# Patient Record
Sex: Female | Born: 1950 | Race: White | Hispanic: No | Smoking: Former smoker
Health system: Southern US, Community
[De-identification: ages and names within clinical notes are randomized; demographics above are authoritative.]

---

## 2004-09-09 ENCOUNTER — Encounter: Admission: RE | Admit: 2004-09-09 | Discharge: 2004-09-09 | Payer: Self-pay | Admitting: Obstetrics and Gynecology

## 2007-04-23 ENCOUNTER — Other Ambulatory Visit: Admission: RE | Admit: 2007-04-23 | Discharge: 2007-04-23 | Payer: Self-pay | Admitting: Family Medicine

## 2007-04-30 ENCOUNTER — Encounter: Admission: RE | Admit: 2007-04-30 | Discharge: 2007-04-30 | Payer: Self-pay | Admitting: Family Medicine

## 2008-05-16 ENCOUNTER — Encounter: Admission: RE | Admit: 2008-05-16 | Discharge: 2008-05-16 | Payer: Self-pay | Admitting: Family Medicine

## 2008-11-24 ENCOUNTER — Encounter: Admission: RE | Admit: 2008-11-24 | Discharge: 2008-11-24 | Payer: Self-pay | Admitting: Family Medicine

## 2009-05-25 ENCOUNTER — Encounter: Admission: RE | Admit: 2009-05-25 | Discharge: 2009-05-25 | Payer: Self-pay | Admitting: Family Medicine

## 2010-04-04 ENCOUNTER — Encounter: Payer: Self-pay | Admitting: Obstetrics and Gynecology

## 2011-04-14 ENCOUNTER — Other Ambulatory Visit: Payer: Self-pay | Admitting: Family Medicine

## 2011-04-14 DIAGNOSIS — Z1231 Encounter for screening mammogram for malignant neoplasm of breast: Secondary | ICD-10-CM

## 2011-05-05 ENCOUNTER — Ambulatory Visit
Admission: RE | Admit: 2011-05-05 | Discharge: 2011-05-05 | Disposition: A | Discharge status: Home / Self Care | Payer: BC Managed Care – PPO | Source: Ambulatory Visit | Attending: Family Medicine | Admitting: Family Medicine

## 2011-05-05 DIAGNOSIS — Z1231 Encounter for screening mammogram for malignant neoplasm of breast: Secondary | ICD-10-CM

## 2011-07-05 ENCOUNTER — Other Ambulatory Visit: Payer: Self-pay | Admitting: Family Medicine

## 2011-07-05 DIAGNOSIS — Z780 Asymptomatic menopausal state: Secondary | ICD-10-CM

## 2011-08-23 ENCOUNTER — Other Ambulatory Visit: Payer: BC Managed Care – PPO

## 2011-09-29 ENCOUNTER — Other Ambulatory Visit: Payer: BC Managed Care – PPO

## 2012-04-02 ENCOUNTER — Other Ambulatory Visit: Payer: Self-pay | Admitting: Family Medicine

## 2012-04-02 DIAGNOSIS — Z1231 Encounter for screening mammogram for malignant neoplasm of breast: Secondary | ICD-10-CM

## 2012-06-04 ENCOUNTER — Ambulatory Visit
Admission: RE | Admit: 2012-06-04 | Discharge: 2012-06-04 | Disposition: A | Discharge status: Home / Self Care | Payer: BC Managed Care – PPO | Source: Ambulatory Visit | Attending: Family Medicine | Admitting: Family Medicine

## 2012-06-04 DIAGNOSIS — Z1231 Encounter for screening mammogram for malignant neoplasm of breast: Secondary | ICD-10-CM

## 2012-06-04 DIAGNOSIS — Z780 Asymptomatic menopausal state: Secondary | ICD-10-CM

## 2013-05-07 ENCOUNTER — Other Ambulatory Visit: Payer: Self-pay

## 2013-05-07 DIAGNOSIS — Z1231 Encounter for screening mammogram for malignant neoplasm of breast: Secondary | ICD-10-CM

## 2013-07-09 ENCOUNTER — Encounter (INDEPENDENT_AMBULATORY_CARE_PROVIDER_SITE_OTHER): Payer: Self-pay

## 2013-07-09 ENCOUNTER — Ambulatory Visit
Admission: RE | Admit: 2013-07-09 | Discharge: 2013-07-09 | Disposition: A | Discharge status: Home / Self Care | Payer: Self-pay | Source: Ambulatory Visit

## 2013-07-09 DIAGNOSIS — Z1231 Encounter for screening mammogram for malignant neoplasm of breast: Secondary | ICD-10-CM

## 2015-01-27 ENCOUNTER — Other Ambulatory Visit: Payer: Self-pay

## 2015-01-27 DIAGNOSIS — Z1231 Encounter for screening mammogram for malignant neoplasm of breast: Secondary | ICD-10-CM

## 2015-03-20 ENCOUNTER — Ambulatory Visit
Admission: RE | Admit: 2015-03-20 | Discharge: 2015-03-20 | Disposition: A | Discharge status: Home / Self Care | Payer: BLUE CROSS/BLUE SHIELD | Source: Ambulatory Visit

## 2015-03-20 DIAGNOSIS — Z1231 Encounter for screening mammogram for malignant neoplasm of breast: Secondary | ICD-10-CM

## 2015-06-22 ENCOUNTER — Ambulatory Visit (INDEPENDENT_AMBULATORY_CARE_PROVIDER_SITE_OTHER): Payer: BLUE CROSS/BLUE SHIELD

## 2015-06-22 ENCOUNTER — Ambulatory Visit (INDEPENDENT_AMBULATORY_CARE_PROVIDER_SITE_OTHER): Payer: BLUE CROSS/BLUE SHIELD | Admitting: Podiatry

## 2015-06-22 ENCOUNTER — Encounter: Payer: Self-pay | Admitting: Podiatry

## 2015-06-22 VITALS — BP 132/66 | HR 69 | Resp 12

## 2015-06-22 DIAGNOSIS — R52 Pain, unspecified: Secondary | ICD-10-CM

## 2015-06-22 DIAGNOSIS — M79672 Pain in left foot: Secondary | ICD-10-CM

## 2015-06-22 DIAGNOSIS — M779 Enthesopathy, unspecified: Secondary | ICD-10-CM | POA: Diagnosis not present

## 2015-06-22 DIAGNOSIS — M19072 Primary osteoarthritis, left ankle and foot: Secondary | ICD-10-CM | POA: Diagnosis not present

## 2015-06-22 DIAGNOSIS — M722 Plantar fascial fibromatosis: Secondary | ICD-10-CM | POA: Diagnosis not present

## 2015-06-22 MED ORDER — DICLOFENAC SODIUM 75 MG PO TBEC: 75.0000 mg | DELAYED_RELEASE_TABLET | Freq: Two times a day (BID) | ORAL | Status: AC

## 2015-06-22 NOTE — Progress Notes (Signed)
   Subjective:    Patient ID: Madison Fields, female    DOB: [birth year], 65 y.o.   MRN: [id number]  HPI  65 year old female presents to the office today with concerns of pain to the arch and the ball of her left foot. She states the pain is worse with walking on her left foot. This has been ongoing for 2 weeks. She compalins of pain to the base of the 2nd and 3rd toe, mostly when she tries to dorsiflex the toes. She also gets pain to the arch of the left foot.   Also has pain to the right heel. This has been ongoing for at least a month. She states it hurts when she first gets up.   No history of injury or trauma. No tingling or numbness. No swelling or redness. No recent treatment. No other complaints at this time.    Review of Systems  Musculoskeletal: Positive for gait problem.  All other systems reviewed and are negative.      Objective:   Physical Exam General: AAO x3, NAD  Dermatological: Are present overlying the dorsal aspect of the first metatarsal from prior bunion surgery which are well-healed. There is no open lesions or pre-ulcerative lesions identified at this time.  Vascular: Dorsalis Pedis artery and Posterior Tibial artery pedal pulses are 2/4 bilateral with immedate capillary fill time. Pedal hair growth present. No varicosities and no lower extremity edema present bilateral. There is no pain with calf compression, swelling, warmth, erythema.   Neruologic: Grossly intact via light touch bilateral. Vibratory intact via tuning fork bilateral. Protective threshold with Semmes Wienstein monofilament intact to all pedal sites bilateral. Patellar and Achilles deep tendon reflexes 2+ bilateral. No Babinski or clonus noted bilateral.   Musculoskeletal: There is pain of range of motion of the second MTPJ on the left side. There is restriction of range of motion without any crepitation. There is no area pinpoint tenderness there is no pain the vibratory sensation. There is  tenderness palpation along the medial band of plantar fascial in the arch of the foot on the left foot. The plantar fascia appears to be intact. On the right foot there is tenderness on the plantar medial tubercle of the calcaneus at the insertion the plantar fascia. There is no pain along the course of the plantar fascia. There is no pain with medial to lateral compression of the calcaneous bilaterally. There is decreased range of motion bilateral first MTPJ. No other areas of tenderness to bilateral lower extremities.   Gait: Unassisted, Nonantalgic.      Assessment & Plan:  65 year old female right plantar fasciitis; left arch pain/plantar fasciitis, capsulitis second MTPJ -Treatment options discussed including all alternatives, risks, and complications -X-rays were obtained and reviewed with the patient. Arthritic changes present left second MTPJ. Heel spur right side. Hardware intact and prior surgery. No evidence of acute fracture. -Etiology of symptoms were discussed -Steroid injection but she wishes to hold off. -Prescribed Voltaren -Plantar fascial braces. Stretching and icing daily. Discussed supportive shoe gear. Discussed orthotics. -For the left side I also discuss orthotics with a cut out to help take pressure the second MTPJ. Discussed progression of the arthritis. -She was scanned for orthotics today and they were sent to [facility] labs. -Follow-up in 3 weeks or sooner if any problems arise. In the meantime, encouraged to call the office with any questions, concerns, change in symptoms.   Celesta Gentile, dPM

## 2015-06-22 NOTE — Patient Instructions (Signed)

## 2015-06-30 ENCOUNTER — Telehealth: Payer: Self-pay | Admitting: *Deleted

## 2015-06-30 ENCOUNTER — Ambulatory Visit: Payer: BLUE CROSS/BLUE SHIELD | Admitting: Podiatry

## 2015-06-30 MED ORDER — MELOXICAM 15 MG PO TABS: 15.0000 mg | ORAL_TABLET | Freq: Every day | ORAL | Status: AC

## 2015-06-30 NOTE — Telephone Encounter (Addendum)
Pt states the diclofenac is giving her terrible stomach cramps, didn't the 1st day but by Friday the cramping was keeping her awake.  Pt states an orthopedic doctor had given her Meloxicam and she had no ill effects.  Dr. Berton Lan switch to Meloxicam.  Informed pt the rx had been sent to the pharmacy.

## 2015-07-20 ENCOUNTER — Encounter: Payer: Self-pay | Admitting: Podiatry

## 2015-07-20 ENCOUNTER — Ambulatory Visit (INDEPENDENT_AMBULATORY_CARE_PROVIDER_SITE_OTHER): Payer: BLUE CROSS/BLUE SHIELD | Admitting: Podiatry

## 2015-07-20 DIAGNOSIS — M19072 Primary osteoarthritis, left ankle and foot: Secondary | ICD-10-CM | POA: Diagnosis not present

## 2015-07-20 DIAGNOSIS — M79672 Pain in left foot: Secondary | ICD-10-CM | POA: Diagnosis not present

## 2015-07-20 DIAGNOSIS — M722 Plantar fascial fibromatosis: Secondary | ICD-10-CM

## 2015-07-20 DIAGNOSIS — M779 Enthesopathy, unspecified: Secondary | ICD-10-CM | POA: Diagnosis not present

## 2015-07-21 DIAGNOSIS — M722 Plantar fascial fibromatosis: Secondary | ICD-10-CM | POA: Insufficient documentation

## 2015-07-21 DIAGNOSIS — M779 Enthesopathy, unspecified: Secondary | ICD-10-CM | POA: Insufficient documentation

## 2015-07-21 DIAGNOSIS — M79673 Pain in unspecified foot: Secondary | ICD-10-CM | POA: Insufficient documentation

## 2015-07-21 NOTE — Progress Notes (Signed)
Patient ID: Madison Fields, female   DOB: [birth year], 65 y.o.   MRN: [id number]  Subjective: 65 year old female presents the office today for follow-up evaluation of plantar fasciitis, arch pain, capsulitis second MPJ. She states that overall she is doing better. She was taken anti-inflammatories as well as stretching and wearing the plantar fascial braces. She presents a pickup orthotics as well.  Denies any systemic complaints such as fevers, chills, nausea, vomiting. No acute changes since last appointment, and no other complaints at this time.   Objective: AAO x3, NAD DP/PT pulses palpable bilaterally, CRT less than 3 seconds Protective sensation intact with Simms Weinstein monofilament There is mild, but greatly improved, tenderness to palpation along the plantar medial tubercle of the calcaneus at the insertion of plantar fascia on the right and left foot. There is no pain along the course of the plantar fascia within the arch of the foot. Plantar fascia appears to be intact. There is no pain with lateral compression of the calcaneus or pain with vibratory sensation. There is no pain along the course or insertion of the achilles tendon. No other areas of tenderness to bilateral lower extremities. There is no tenderness on second MPJ of the left foot. No areas of pinpoint bony tenderness or pain with vibratory sensation. MMT 5/5, ROM WNL. No edema, erythema, increase in warmth to bilateral lower extremities.  No open lesions or pre-ulcerative lesions.  No pain with calf compression, swelling, warmth, erythema  Assessment: Resolving bilateral foot pain.  Plan: -All treatment options discussed with the patient including all alternatives, risks, complications.  -At this time continue stretching, icing, anti-inflammatories. Continue supportive shoe gear. Orthotics were dispensed today and oral and written break in instructions were discussed with the patient. -Follow-up in 4 weeks if symptoms  continue or sooner if any issues are to arise. -Patient encouraged to call the office with any questions, concerns, change in symptoms.   Celesta Gentile, DPM

## 2015-08-17 ENCOUNTER — Ambulatory Visit: Payer: BLUE CROSS/BLUE SHIELD | Admitting: Podiatry

## 2016-01-29 ENCOUNTER — Telehealth: Payer: Self-pay | Admitting: Gynecologic Oncology

## 2016-01-29 ENCOUNTER — Encounter: Payer: Self-pay | Admitting: Gynecologic Oncology

## 2016-01-29 NOTE — Telephone Encounter (Signed)
Tc call to pt to schedule appt w/Dr. Denman George. Original appt was 12/27, but the pt wanted something sooner. Gave pt appt for  12/13 at 930am. Pt agreed to the appt date and time. Demographics verified. Letter mailed to the pt.

## 2016-02-16 ENCOUNTER — Ambulatory Visit (HOSPITAL_BASED_OUTPATIENT_CLINIC_OR_DEPARTMENT_OTHER)
Admission: RE | Admit: 2016-02-16 | Discharge: 2016-02-16 | Disposition: A | Discharge status: Home / Self Care | Payer: BLUE CROSS/BLUE SHIELD | Source: Ambulatory Visit | Attending: Podiatry | Admitting: Podiatry

## 2016-02-16 ENCOUNTER — Encounter: Payer: Self-pay | Admitting: Podiatry

## 2016-02-16 ENCOUNTER — Ambulatory Visit (INDEPENDENT_AMBULATORY_CARE_PROVIDER_SITE_OTHER): Payer: BLUE CROSS/BLUE SHIELD | Admitting: Podiatry

## 2016-02-16 DIAGNOSIS — M19072 Primary osteoarthritis, left ankle and foot: Secondary | ICD-10-CM | POA: Insufficient documentation

## 2016-02-16 DIAGNOSIS — M79672 Pain in left foot: Secondary | ICD-10-CM | POA: Insufficient documentation

## 2016-02-16 DIAGNOSIS — R52 Pain, unspecified: Secondary | ICD-10-CM | POA: Diagnosis not present

## 2016-02-16 DIAGNOSIS — M722 Plantar fascial fibromatosis: Secondary | ICD-10-CM | POA: Diagnosis not present

## 2016-02-16 MED ORDER — METHYLPREDNISOLONE 4 MG PO TBPK: ORAL_TABLET | ORAL | 0 refills | Status: DC

## 2016-02-16 NOTE — Progress Notes (Signed)
Subjective: 65 year old female presents the office today for recurrence of left heel pain. She states the bottom of the heel is hurting quite a bit. After her last appointment with me she was doing pretty good however that she first gets up of she sits for some time and stands backup she gets quite a bit of pain to her heel. She is also mild tenderness to the right foot although she states that this is manageable. She states that she has pain in the mornings to both of her feet. Denies any systemic complaints such as fevers, chills, nausea, vomiting. No acute changes since last appointment, and no other complaints at this time.   Objective: AAO x3, NAD DP/PT pulses palpable bilaterally, CRT less than 3 seconds Tenderness to palpation along the plantar medial and lateral tubercle of the calcaneus at the insertion of plantar fascia on the left foot. There is no pain along the course of the plantar fascia within the arch of the foot. Plantar fascia appears to be intact. There is no pain with lateral compression of the calcaneus or pain with vibratory sensation. There is no pain along the course or insertion of the achilles tendon. Minimal tenderness to palpation to the right heel. There is no other areas of pinpoint bony tenderness or pain the vibratory sensation bilaterally.No other areas of tenderness to bilateral lower extremities. No open lesions or pre-ulcerative lesions.  No pain with calf compression, swelling, warmth, erythema  Assessment: 65 year old female reoccurrence left heel pain, plantar fasciitis  Plan: -All treatment options discussed with the patient including all alternatives, risks, complications.  -X-rays were obtained and reviewed with the patient. No evidence of acute fracture.  -Patient elects to proceed with steroid injection into the left heel. Under sterile skin preparation, a total of 2.5cc of kenalog 10, 0.5% Marcaine plain, and 2% lidocaine plain were infiltrated into the  symptomatic area without complication. A band-aid was applied. Patient tolerated the injection well without complication. Post-injection care with discussed with the patient. Discussed with the patient to ice the area over the next couple of days to help prevent a steroid flare.  -Night splint dispensed -Discussed possible physical therapy if she continues to have tightness and pain in the mornings or after rest -Prescribed Medrol Dosepak -Ice to the area -Stretching daily -Follow-up in 3 weeks -Patient encouraged to call the office with any questions, concerns, change in symptoms.   Celesta Gentile, DPM

## 2016-02-23 NOTE — Progress Notes (Signed)
Consult Note: Gyn-Onc  Consult was requested by Dr. Nelda Marseille for the evaluation of Madison Fields 65 y.o. female  CC:  Chief Complaint  Patient presents with  . Genital Warts    Assessment/Plan:  Madison Fields  is a 65 y.o.  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  I am recommending combination laser and excisional procedure.  We discussed options including TCA, Aldara (both of which she has tried) versus surgery. She is leaning towards surgery.  I discussed that we would debulk the thicker lesions with a scalpel and may require some suturing followed by laser of the flat areas. I discussed that there is a very high probability of recurrence after treatment and she may require additional therapies.  I discussed postoperative healing and wound care.  I discussed risks including infection, disfigurement, wound separation.   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 She did not tolerate the aldara due to severe perineal irritation.  She is very irritated during toileting and sitting from the lesions and desires treatment for them that palliates symptoms.  Last colonoscopy was normal in 2017.  Current Meds:  Outpatient Encounter Prescriptions as of 02/24/2016  Medication Sig  . atorvastatin (LIPITOR) 10 MG tablet TAKE ONE TABLET BY MOUTH ONCE A DAY FOR 30 DAYS.  . Calcium Carbonate (CALCIUM 600 PO) Take by mouth.  . Cholecalciferol (VITAMIN D PO) Take by mouth.  . meloxicam (MOBIC) 15 MG tablet Take 1 tablet (15 mg total) by mouth daily.  Marland Kitchen PREMARIN vaginal cream USE AS DIRECTED 0.5G TWICE PER WEEK VAGINAL 30 DAYS  . rosuvastatin (CRESTOR) 5 MG tablet Take 5 mg by mouth daily.  . sertraline (ZOLOFT) 25 MG tablet TAKE 1/2 TABLET ONCE A DAY BY MOUTH  .  diclofenac (VOLTAREN) 75 MG EC tablet Take 1 tablet (75 mg total) by mouth 2 (two) times daily. (Patient not taking: Reported on 02/24/2016)  . [DISCONTINUED] methylPREDNISolone (MEDROL DOSEPAK) 4 MG TBPK tablet Take as directed (Patient not taking: Reported on 02/24/2016)   No facility-administered encounter medications on file as of 02/24/2016.     Allergy:  Allergies  Allergen Reactions  . Diclofenac Sodium Other (See Comments)    Abdominal cramping.    Social Hx:   Social History   Social History  . Marital status: Unknown    Spouse name: N/A  . Number of children: N/A  . Years of education: N/A   Occupational History  . Not on file.   Social History Main Topics  . Smoking status: Former Smoker    Packs/day: 0.50    Years: 15.00    Types: Cigarettes    Quit date: 03/13/2006  . Smokeless tobacco: Never Used  . Alcohol use 0.0 oz/week     Comment: wine with dinner occasionally  . Drug use: No  . Sexual activity: Not on file   Other Topics Concern  . Not on file   Social History Narrative  . No narrative on file    Past Surgical Hx: History reviewed. No pertinent surgical history.  Past Medical Hx: History reviewed. No pertinent past medical history.  Past Gynecological History:  Anogenital warts. Remote history of hysterectomy for fibroids. No dysplasia. Last biopsy was in 2013 which showed benign warts. No LMP recorded.  Patient has had a hysterectomy.  Family Hx: History reviewed. No pertinent family history.  Review of Systems:  Constitutional  Feels well,    ENT Normal appearing ears and nares bilaterally Skin/Breast  No rash, sores, jaundice, itching, dryness Cardiovascular  No chest pain, shortness of breath, or edema  Pulmonary  No cough or wheeze.  Gastro Intestinal  No nausea, vomitting, or diarrhoea. No bright red blood per rectum, no abdominal pain, change in bowel movement, or constipation.  Genito Urinary  No frequency, urgency,  dysuria, + anogenital irritation Musculo Skeletal  No myalgia, arthralgia, joint swelling or pain  Neurologic  No weakness, numbness, change in gait,  Psychology  No depression, anxiety, insomnia.   Vitals:  Blood pressure 139/71, pulse 61, temperature 98.1 F (36.7 C), temperature source Oral, resp. rate 18, height 5' 1.75" (1.568 m), weight 156 lb 4.8 oz (70.9 kg), SpO2 100 %.  Physical Exam: WD in NAD Neck  Supple NROM, without any enlargements.  Lymph Node Survey No cervical supraclavicular or inguinal adenopathy Cardiovascular  Pulse normal rate, regularity and rhythm. S1 and S2 normal.  Lungs  Clear to auscultation bilateraly, without wheezes/crackles/rhonchi. Good air movement.  Skin  No rash/lesions/breakdown  Psychiatry  Alert and oriented to person, place, and time  Abdomen  Normoactive bowel sounds, abdomen soft, non-tender and obese without evidence of hernia.  Back No CVA tenderness Genito Urinary  Vulva/vagina: 6cm patch of raised warty tissue on right medial buttock adjacent to anus and posterior perineum. Ring of warty disease posteriorally around introitus Lt>Rt.  Rectal  deferred Extremities  No bilateral cyanosis, clubbing or edema.   Madison Eva, MD  02/24/2016, 10:33 AM

## 2016-02-24 ENCOUNTER — Ambulatory Visit: Payer: BLUE CROSS/BLUE SHIELD | Attending: Gynecologic Oncology | Admitting: Gynecologic Oncology

## 2016-02-24 ENCOUNTER — Encounter: Payer: Self-pay | Admitting: Gynecologic Oncology

## 2016-02-24 VITALS — BP 139/71 | HR 61 | Temp 98.1°F | Resp 18 | Ht 61.75 in | Wt 156.3 lb

## 2016-02-24 DIAGNOSIS — Z888 Allergy status to other drugs, medicaments and biological substances status: Secondary | ICD-10-CM | POA: Diagnosis not present

## 2016-02-24 DIAGNOSIS — Z90710 Acquired absence of both cervix and uterus: Secondary | ICD-10-CM | POA: Diagnosis not present

## 2016-02-24 DIAGNOSIS — Z87891 Personal history of nicotine dependence: Secondary | ICD-10-CM | POA: Diagnosis not present

## 2016-02-24 DIAGNOSIS — A630 Anogenital (venereal) warts: Secondary | ICD-10-CM

## 2016-02-24 NOTE — Patient Instructions (Signed)
Madison Fields will call you with Surgical date and time.               Preparing for your Surgery  Plan for surgery on: To be determined.  Pre-operative Testing -You will receive a phone call from presurgical testing at [facility] to arrange for a pre-operative testing appointment before your surgery.  This appointment normally occurs one to two weeks before your scheduled surgery.   -Bring your insurance card, copy of an advanced directive if applicable, medication list  -At that visit, you will be asked to sign a consent for a possible blood transfusion in case a transfusion becomes necessary during surgery.  The need for a blood transfusion is rare but having consent is a necessary part of your care.     -You should not be taking blood thinners or aspirin at least ten days prior to surgery unless instructed by your surgeon.  Day Before Surgery at [place] will be asked to take in a light diet the day before surgery.  Avoid carbonated beverages.  You will be advised to have nothing to eat or drink after midnight the evening before.     Eat a light diet the day before surgery.  Examples including soups, broths,  toast, yogurt, mashed potatoes.  Things to avoid include carbonated beverages  (fizzy beverages), raw fruits and raw vegetables, or beans.    If your bowels are filled with gas, your surgeon will have difficulty  visualizing your pelvic organs which increases your surgical risks.  Your role in recovery Your role is to become active as soon as directed by your doctor, while still giving yourself time to heal.  Rest when you feel tired. You will be asked to do the following in order to speed your recovery:  - Cough and breathe deeply. This helps toclear and expand your lungs and can prevent pneumonia. You may be given a spirometer to practice deep breathing. A staff member will show you how to use the spirometer. - Do mild physical activity. Walking or moving your legs help your  circulation and body functions return to normal. A staff member will help you when you try to walk and will provide you with simple exercises. Do not try to get up or walk alone the first time. - Actively manage your pain. Managing your pain lets you move in comfort. We will ask you to rate your pain on a scale of zero to 10. It is your responsibility to tell your doctor or nurse where and how much you hurt so your pain can be treated.  Special Considerations -If you are diabetic, you may be placed on insulin after surgery to have closer control over your blood sugars to promote healing and recovery.  This does not mean that you will be discharged on insulin.  If applicable, your oral antidiabetics will be resumed when you are tolerating a solid diet.  -Your final pathology results from surgery should be available by the Friday after surgery and the results will be relayed to you when available.  Eat a light diet the day before surgery.  Examples including soups, broths, toast, yogurt, mashed potatoes.  Things to avoid include carbonated beverages (fizzy beverages), raw fruits and raw vegetables, or beans.   If your bowels are filled with gas, your surgeon will have difficulty visualizing your pelvic organs which increases your surgical risks. Blood Transfusion Information WHAT IS A BLOOD TRANSFUSION? A transfusion is the replacement of blood or  some of its parts. Blood is made up of multiple cells which provide different functions.  Red blood cells carry oxygen and are used for blood loss replacement.  White blood cells fight against infection.  Platelets control bleeding.  Plasma helps clot blood.  Other blood products are available for specialized needs, such as hemophilia or other clotting disorders. BEFORE THE TRANSFUSION  Who gives blood for transfusions?   You may be able to donate blood to be used at a later date on yourself (autologous donation).  Relatives can be asked to  donate blood. This is generally not any safer than if you have received blood from a stranger. The same precautions are taken to ensure safety when a relative's blood is donated.  Healthy volunteers who are fully evaluated to make sure their blood is safe. This is blood bank blood. Transfusion therapy is the safest it has ever been in the practice of medicine. Before blood is taken from a donor, a complete history is taken to make sure that person has no history of diseases nor engages in risky social behavior (examples are intravenous drug use or sexual activity with multiple partners). The donor's travel history is screened to minimize risk of transmitting infections, such as malaria. The donated blood is tested for signs of infectious diseases, such as HIV and hepatitis. The blood is then tested to be sure it is compatible with you in order to minimize the chance of a transfusion reaction. If you or a relative donates blood, this is often done in anticipation of surgery and is not appropriate for emergency situations. It takes many days to process the donated blood. RISKS AND COMPLICATIONS Although transfusion therapy is very safe and saves many lives, the main dangers of transfusion include:   Getting an infectious disease.  Developing a transfusion reaction. This is an allergic reaction to something in the blood you were given. Every precaution is taken to prevent this. The decision to have a blood transfusion has been considered carefully by your caregiver before blood is given. Blood is not given unless the benefits outweigh the risks.

## 2016-02-29 ENCOUNTER — Telehealth: Payer: Self-pay | Admitting: *Deleted

## 2016-02-29 NOTE — Telephone Encounter (Signed)
Pt states saw Dr. Jacqualyn Posey over a 1 week ago, had shots and steroid, felt better a couple of days, and even wore her husband's boot which helped but is too big. I told pt I felt she would benefit from an appt and possible injections again and possibly oral antiinflammatory. Pt agreed and I transferred to schedulers.

## 2016-03-01 ENCOUNTER — Encounter: Payer: Self-pay | Admitting: Podiatry

## 2016-03-01 ENCOUNTER — Ambulatory Visit (INDEPENDENT_AMBULATORY_CARE_PROVIDER_SITE_OTHER): Payer: BLUE CROSS/BLUE SHIELD | Admitting: Podiatry

## 2016-03-01 DIAGNOSIS — M722 Plantar fascial fibromatosis: Secondary | ICD-10-CM | POA: Diagnosis not present

## 2016-03-01 MED ORDER — MELOXICAM 15 MG PO TABS: 15.0000 mg | ORAL_TABLET | Freq: Every day | ORAL | 1 refills | Status: DC

## 2016-03-01 NOTE — Patient Instructions (Signed)

## 2016-03-08 NOTE — Progress Notes (Signed)
Subjective: 65 year old female presents the office today for concerns of reoccurring pain to her left heel. She states that she did get some relief after the injection she took the Medrol Dosepak. She was starting to feel better for the pain is starting to come back. She denies any numbness or tingling. Denies any swelling or redness. She's been stretching, icing as well. She is no longer taking the anti-inflammatory medicine. His been using over-the-counter inserts. Denies any systemic complaints such as fevers, chills, nausea, vomiting. No acute changes since last appointment, and no other complaints at this time.   Objective: AAO x3, NAD DP/PT pulses palpable bilaterally, CRT less than 3 seconds Tenderness continues to palpation along the plantar medial tubercle of the calcaneus at the insertion of plantar fascia on the left foot. There is no pain along the course of the plantar fascia within the arch of the foot. Plantar fascia appears to be intact. There is no pain with lateral compression of the calcaneus or pain with vibratory sensation. There is no pain along the course or insertion of the achilles tendon. No other areas of tenderness to bilateral lower extremities. No open lesions or pre-ulcerative lesions.  No pain with calf compression, swelling, warmth, erythema  Assessment: Left heel pain, plantar fasciitis  Plan: -All treatment options discussed with the patient including all alternatives, risks, complications.  Patient elects to proceed with steroid injection into the left heel. Under sterile skin preparation, a total of 2.5cc of kenalog 10, 0.5% Marcaine plain, and 2% lidocaine plain were infiltrated into the symptomatic area without complication. A band-aid was applied. Patient tolerated the injection well without complication. Post-injection care with discussed with the patient. Discussed with the patient to ice the area over the next couple of days to help prevent a steroid flare.   -Plantar fascial taping applied -Prescribed mobic. Discussed side effects of the medication and directed to stop if any are to occur and call the office.  -Discussed PT -Continue icing/stretching daily. Also discussed CMO -Patient encouraged to call the office with any questions, concerns, change in symptoms.   Celesta Gentile, DPM

## 2016-03-22 ENCOUNTER — Encounter: Payer: Self-pay | Admitting: Podiatry

## 2016-03-22 ENCOUNTER — Ambulatory Visit (INDEPENDENT_AMBULATORY_CARE_PROVIDER_SITE_OTHER): Payer: BLUE CROSS/BLUE SHIELD | Admitting: Podiatry

## 2016-03-22 DIAGNOSIS — M722 Plantar fascial fibromatosis: Secondary | ICD-10-CM | POA: Diagnosis not present

## 2016-03-22 NOTE — Progress Notes (Signed)
Subjective: 66 year old female presents the office today for concerns of reoccurring pain to her left heel. She's tried changing her shoes and she is also order some new dress inserts. She states that she is getting somewhat better although slowly. She has been stretching, icing daily. The injections to help. She has been wearing a tennis shoe which is also been helping. She denies any numbness or tingling. No recent injury. Denies any systemic complaints such as fevers, chills, nausea, vomiting. No acute changes since last appointment, and no other complaints at this time.   Objective: AAO x3, NAD DP/PT pulses palpable bilaterally, CRT less than 3 seconds Tenderness continues to palpation along the plantar medial tubercle of the calcaneus at the insertion of plantar fascia on the left foot. There is also tenderness of plantar central aspect of the heel. There is no pain along the course of the plantar fascia within the arch of the foot. Plantar fascia appears to be intact. There is no pain with lateral compression of the calcaneus or pain with vibratory sensation. There is no pain along the course or insertion of the achilles tendon. No other areas of tenderness to bilateral lower extremities. No open lesions or pre-ulcerative lesions.  No pain with calf compression, swelling, warmth, erythema  Assessment: Left heel pain, plantar fasciitis  Plan: -All treatment options discussed with the patient including all alternatives, risks, complications.  Patient elects to proceed with steroid injection into the left heel. The injection today was done more plan central within the heel.Under sterile skin preparation, a total of 2.5cc of kenalog 10, 0.5% Marcaine plain, and 2% lidocaine plain were infiltrated into the symptomatic area without complication. A band-aid was applied. Patient tolerated the injection well without complication. Post-injection care with discussed with the patient. Discussed with the  patient to ice the area over the next couple of days to help prevent a steroid flare.  -at this time a discussed with her EPAT and/or PT. She will likely proceed with EPAT. A brochure was given today.  -Continue mobic prn -Continue ice/stretching/supportive shoes/orthotics.  -Follow-up in 3 weeks or sooner if needed.  -Patient encouraged to call the office with any questions, concerns, change in symptoms.   Celesta Gentile, DPM

## 2016-04-04 ENCOUNTER — Telehealth: Payer: Self-pay | Admitting: *Deleted

## 2016-04-04 NOTE — Telephone Encounter (Addendum)
Pt states she has questions about custom made orthotics. Pt states she is having a heck of a time with plantar fasciitis, and the orthotics don't fit in all of her shoes, she would like a dress pair and would like to know the cost as well. 05/13/2016-Pt states going on 4 months now with problem Plantar Fasciitis, worsening with the EPAT, and would like to discuss other option, is really limiting her life style. I spoke with pt, she states she feels like there is something else going on with her foot, not just plantar fasciitis. I told pt she would benefit from seeing Dr. Jacqualyn Posey and getting a good explanation of other treatments. Pt agreed and I transferred to schedulers.

## 2016-04-05 ENCOUNTER — Telehealth: Payer: Self-pay | Admitting: *Deleted

## 2016-04-05 NOTE — Telephone Encounter (Signed)
Called patient on her mobile and left a message about the inserts and to call me back in the [place] office and I would be glad to answer any questions that she may have.Madison Fields

## 2016-04-06 ENCOUNTER — Telehealth: Payer: Self-pay | Admitting: Podiatry

## 2016-04-08 ENCOUNTER — Telehealth: Payer: Self-pay | Admitting: *Deleted

## 2016-04-08 NOTE — Telephone Encounter (Signed)
Patient called back and I stated that the dress pair of inserts would be $398 and we would file with the insurance and if the insurance had a deductible would have to meet that and then pay what the insurance would leave after we filed the insurance. Lattie Haw

## 2016-04-12 ENCOUNTER — Ambulatory Visit (INDEPENDENT_AMBULATORY_CARE_PROVIDER_SITE_OTHER): Payer: BLUE CROSS/BLUE SHIELD | Admitting: Podiatry

## 2016-04-12 ENCOUNTER — Encounter: Payer: Self-pay | Admitting: Podiatry

## 2016-04-12 DIAGNOSIS — M722 Plantar fascial fibromatosis: Secondary | ICD-10-CM

## 2016-04-12 NOTE — Progress Notes (Signed)
Subjective: 66 year old female presents the office today for follow-up evaluation of left heel pain, plantar fasciitis. She states that she is doing substantially better compared to last appointment. She has been continuing with supportive shoes, orthotics, stretching, night splint. She has minimal discomfort to her heel and overall is much improved. Denies any systemic complaints such as fevers, chills, nausea, vomiting. No acute changes since last appointment, and no other complaints at this time.   Objective: AAO x3, NAD DP/PT pulses palpable bilaterally, CRT less than 3 seconds There is very minimal tenderness along the plantar medial tubercle of the calcaneus at the insertion of plantar fascia on the left foot more along the lateral heel. There is no pain along the course of the plantar fascia within the arch of the foot. Plantar fascia appears to be intact. There is no pain with lateral compression of the calcaneus or pain with vibratory sensation. There is no pain along the course or insertion of the achilles tendon. No other areas of tenderness to bilateral lower extremities. No open lesions or pre-ulcerative lesions.  No pain with calf compression, swelling, warmth, erythema  Assessment: Left heel pain, plantar fasciitis with much improvement.   Plan: -All treatment options discussed with the patient including all alternatives, risks, complications.  -At this time she is doing much better. We will hold off on any further steroid injections. Discussed that continue stretching, icing, night splint, orthotics, supportive shoes at all 8 and while at home. If symptoms recur discussed whether she'll likely do EPAT.  -Follow-up as needed.   Celesta Gentile, DPM

## 2016-04-19 ENCOUNTER — Ambulatory Visit (INDEPENDENT_AMBULATORY_CARE_PROVIDER_SITE_OTHER): Payer: BLUE CROSS/BLUE SHIELD | Admitting: Podiatry

## 2016-04-19 ENCOUNTER — Encounter: Payer: Self-pay | Admitting: Podiatry

## 2016-04-19 DIAGNOSIS — M779 Enthesopathy, unspecified: Secondary | ICD-10-CM | POA: Diagnosis not present

## 2016-04-19 DIAGNOSIS — M722 Plantar fascial fibromatosis: Secondary | ICD-10-CM

## 2016-04-19 MED ORDER — METHYLPREDNISOLONE 4 MG PO TBPK: ORAL_TABLET | ORAL | 0 refills | Status: AC

## 2016-04-19 NOTE — Progress Notes (Signed)
Subjective: 66 year old female presents the office today for concerns of her left ankle becoming swollen. She states she thinks it is from the way she is walking as her heel has been hurting. Due to the heel pain she has scheduled EPAT treatments. She denies any recent injury or trauma. Denies any systemic complaints such as fevers, chills, nausea, vomiting. No acute changes since last appointment, and no other complaints at this time.   Objective: AAO x3, NAD DP/PT pulses palpable bilaterally, CRT less than 3 seconds Continuation of pain to palpation along the plantar medial tubercle of the calcaneus at the insertion of plantar fascia on the leftfoot. There is no pain along the course of the plantar fascia within the arch of the foot. Plantar fascia appears to be intact. There is no pain with lateral compression of the calcaneus or pain with vibratory sensation. There is no pain along the course or insertion of the achilles tendon. There is mild swelling on both the medial and lateral aspects of the left ankle but there is currently no tenderness along the flexor/PT/Peroneal tendons. No other areas of tenderness to bilateral lower extremities No open lesions or pre-ulcerative lesions.  No pain with calf compression, swelling, warmth, erythema  Assessment: Chronic left heel pian, plantar fasciitis; tendonitis as a result of compensation.   Plan: -All treatment options discussed with the patient including all alternatives, risks, complications.  -Etiology of symptoms were discussed -Medrol dose pack -Trilock ankle brace -Will start EPAT next week. Please dispense a CAM boot when she comes in for th EPAT.  -Patient encouraged to call the office with any questions, concerns, change in symptoms.   Celesta Gentile, DPM

## 2016-04-26 ENCOUNTER — Ambulatory Visit (INDEPENDENT_AMBULATORY_CARE_PROVIDER_SITE_OTHER): Payer: BLUE CROSS/BLUE SHIELD | Admitting: Podiatry

## 2016-04-26 ENCOUNTER — Ambulatory Visit: Payer: BLUE CROSS/BLUE SHIELD

## 2016-04-26 DIAGNOSIS — M779 Enthesopathy, unspecified: Secondary | ICD-10-CM

## 2016-04-26 DIAGNOSIS — M722 Plantar fascial fibromatosis: Secondary | ICD-10-CM

## 2016-04-26 NOTE — Progress Notes (Signed)
   Subjective:    Patient ID: Madison Fields, female    DOB: February 02, 1951, 66 y.o.   MRN: [id number]  HPI  Pt presents with chronic pain in her left heel she has tried various therapies with no success and is here today for ESWT treatment  Review of Systems   All other systems are negative Objective:   Physical Exam Pain on palpation left heel mid to medial heel band 7 of 10 when pressed       Assessment & Plan:  ESWT therapy administered to Lt heel band for 5 joules at 3000 pulses. EPAt delivered to entire fascial band for 3000 pulses. Advised against use of NSAIDs and ice. Advised on boot and night splint usage. Short CAM walker boot was dispensed today. She is to follow up next week for 2nd treatment

## 2016-04-28 ENCOUNTER — Other Ambulatory Visit: Payer: Self-pay | Admitting: Podiatry

## 2016-04-28 NOTE — Telephone Encounter (Signed)
Pt needs an appt prior to future refills. 

## 2016-05-03 ENCOUNTER — Ambulatory Visit: Payer: BLUE CROSS/BLUE SHIELD

## 2016-05-03 DIAGNOSIS — M722 Plantar fascial fibromatosis: Secondary | ICD-10-CM

## 2016-05-03 DIAGNOSIS — M779 Enthesopathy, unspecified: Secondary | ICD-10-CM

## 2016-05-05 NOTE — Progress Notes (Signed)
   Subjective:    Patient ID: Madison Fields, female    DOB: 06-Oct-1950, 67 y.o.   MRN: [id number]  HPI  Pt presents stating that she has noticed a big improvement in the pain in her Lt heel  Review of Systems   All other systems are negative Objective:   Physical Exam Pain on palpation left heel mid to medial heel band 5 of 10 when pressed       Assessment & Plan:  ESWT therapy administered to Lt heel band for 5 joules at 3000 pulses. EPAt delivered to entire fascial band for 3000 pulses. Advised against use of NSAIDs and ice. Advised on boot and night splint usage.  She is to follow up next week for 3rd  treatment

## 2016-05-10 ENCOUNTER — Ambulatory Visit (INDEPENDENT_AMBULATORY_CARE_PROVIDER_SITE_OTHER): Payer: Self-pay | Admitting: Podiatry

## 2016-05-10 DIAGNOSIS — M722 Plantar fascial fibromatosis: Secondary | ICD-10-CM

## 2016-05-10 DIAGNOSIS — M779 Enthesopathy, unspecified: Secondary | ICD-10-CM

## 2016-05-13 NOTE — Progress Notes (Signed)
   Subjective:    Patient ID: Madison Fields, female    DOB: [birth year], 66 y.o.   MRN: [id number]  HPI Pt presents stating that the pain in her Lt heel has returned with same severity as before starting the treatments.  dReview of Systems   All other systems are negative Objective:   Physical Exam Pain on palpation left heel mid to medial heel band 5 of 10 when pressed       Assessment & Plan:  ESWT therapy administered to Lt heel band for 12 joules at 3000 pulses. EPAt delivered to entire fascial band for 3000 pulses. Advised against use of NSAIDs and ice. Advised on boot and night splint usage.  She is to follow up in 4 weeks for 4th  treatment

## 2016-05-16 ENCOUNTER — Encounter: Payer: Self-pay | Admitting: Podiatry

## 2016-05-16 ENCOUNTER — Ambulatory Visit (INDEPENDENT_AMBULATORY_CARE_PROVIDER_SITE_OTHER): Payer: BLUE CROSS/BLUE SHIELD | Admitting: Podiatry

## 2016-05-16 DIAGNOSIS — M722 Plantar fascial fibromatosis: Secondary | ICD-10-CM

## 2016-05-17 NOTE — Progress Notes (Signed)
Subjective: Ms. Lagorio presents to the office today for follow-up and continued pain of left plantar fasciitis/heel pain. She has attempted multiple treatments including injections, medications, bracing, stretching, immobilization, EPAT without any relief. She states that the EPAT made it more sensitive. She presents today to discuss further treatment. She denies any numbness or tingling and denies any recent injury or trauma. No swelling. Denies any systemic complaints such as fevers, chills, nausea, vomiting. No acute changes since last appointment, and no other complaints at this time.   Objective: AAO x3, NAD DP/PT pulses palpable bilaterally, CRT less than 3 seconds There is continued tenderness to palpation along the plantar medial tubercle of the calcaneus at the insertion of plantar fascia on the left foot and this is unchanged. There is no pain along the course of the plantar fascia within the arch of the foot. Plantar fascia appears to be intact. There is no pain with lateral compression of the calcaneus or pain with vibratory sensation. There is no pain along the course or insertion of the achilles tendon. No other areas of tenderness to bilateral lower extremities. Negative tinel sign.  No open lesions or pre-ulcerative lesions.  No pain with calf compression, swelling, warmth, erythema  Assessment: Chronic left heel pain, heel spur/plantar fasciitis  Plan: -All treatment options discussed with the patient including all alternatives, risks, complications.  -At this time given her ongoing symptoms recommended MRI which is ordered today. This is for surgical planning. -Discussed her pending MRI surgical intervention and she agrees to this but will further discuss after the MRI. Discussed EPF and possible PRP -Patient encouraged to call the office with any questions, concerns, change in symptoms.   Celesta Gentile, DPM

## 2016-05-19 ENCOUNTER — Telehealth: Payer: Self-pay | Admitting: *Deleted

## 2016-05-19 NOTE — Telephone Encounter (Signed)
"  Madison Fields is scheduled for a MRI of left ankle w/out contrast.  She has [employer] and it needs authorization."  I will take care of it.

## 2016-05-23 NOTE — Telephone Encounter (Signed)
I left Madison Fields a message that MRI was authorized.  The authorization is valid from 05/23/2016 to 06/21/2016.  The order ID# is [id number].

## 2016-05-25 ENCOUNTER — Ambulatory Visit
Admission: RE | Admit: 2016-05-25 | Discharge: 2016-05-25 | Disposition: A | Discharge status: Home / Self Care | Payer: BLUE CROSS/BLUE SHIELD | Source: Ambulatory Visit | Attending: Podiatry | Admitting: Podiatry

## 2016-05-26 ENCOUNTER — Encounter: Payer: Self-pay | Admitting: Podiatry

## 2016-05-26 ENCOUNTER — Ambulatory Visit (INDEPENDENT_AMBULATORY_CARE_PROVIDER_SITE_OTHER): Payer: BLUE CROSS/BLUE SHIELD | Admitting: Podiatry

## 2016-05-26 DIAGNOSIS — M722 Plantar fascial fibromatosis: Secondary | ICD-10-CM | POA: Diagnosis not present

## 2016-05-26 NOTE — Patient Instructions (Addendum)
Pre-Operative Instructions  Congratulations, you have decided to take an important step to improving your quality of life.  You can be assured that the doctors of [facility] will be with you every step of the way.  1. Plan to be at the surgery center/hospital at least 1 (one) hour prior to your scheduled time unless otherwise directed by the surgical center/hospital staff.  You must have a responsible adult accompany you, remain during the surgery and drive you home.  Make sure you have directions to the surgical center/hospital and know how to get there on time. 2. For hospital based surgery you will need to obtain a history and physical form from your family physician within 1 month prior to the date of surgery- we will give you a form for you primary physician.  3. We make every effort to accommodate the date you request for surgery.  There are however, times where surgery dates or times have to be moved.  We will contact you as soon as possible if a change in schedule is required.   4. No Aspirin/Ibuprofen for one week before surgery.  If you are on aspirin, any non-steroidal anti-inflammatory medications (Mobic, Aleve, Ibuprofen) you should stop taking it 7 days prior to your surgery.  You make take Tylenol  For pain prior to surgery.  5. Medications- If you are taking daily heart and blood pressure medications, seizure, reflux, allergy, asthma, anxiety, pain or diabetes medications, make sure the surgery center/hospital is aware before the day of surgery so they may notify you which medications to take or avoid the day of surgery. 6. No food or drink after midnight the night before surgery unless directed otherwise by surgical center/hospital staff. 7. No alcoholic beverages 24 hours prior to surgery.  No smoking 24 hours prior to or 24 hours after surgery. 8. Wear loose pants or shorts- loose enough to fit over bandages, boots, and casts. 9. No slip on shoes, sneakers are best. 10. Bring  your boot with you to the surgery center/hospital.  Also bring crutches or a walker if your physician has prescribed it for you.  If you do not have this equipment, it will be provided for you after surgery. 11. If you have not been contracted by the surgery center/hospital by the day before your surgery, call to confirm the date and time of your surgery. 12. Leave-time from work may vary depending on the type of surgery you have.  Appropriate arrangements should be made prior to surgery with your employer. 13. Prescriptions will be provided immediately following surgery by your doctor.  Have these filled as soon as possible after surgery and take the medication as directed. 14. Remove nail polish on the operative foot. 15. Wash the night before surgery.  The night before surgery wash the foot and leg well with the antibacterial soap provided and water paying special attention to beneath the toenails and in between the toes.  Rinse thoroughly with water and dry well with a towel.  Perform this wash unless told not to do so by your physician.  Enclosed: 1 Ice pack (please put in freezer the night before surgery)   1 Hibiclens skin cleaner   Pre-op Instructions  If you have any questions regarding the instructions, do not hesitate to call our office at any point during this process.   [place]: [place], [place] [address] [place]: [address] [place], [place] [address] [phone]  Dr. Celesta Gentile, DPM  Endoscopic Plantar Fasciotomy, Care After Refer to this sheet in the next few weeks. These instructions provide you with information about caring for yourself after your procedure. Your health care provider may also give you more specific instructions. Your treatment has been planned according to current medical practices, but problems sometimes occur. Call your health care provider if you have any problems or questions after your procedure. What can I expect  after the procedure? After the procedure, it is common to have:  Foot pain and stiffness.  Swelling in the incision area. Follow these instructions at home: If you have a boot or protective shoe:   Wear it as told by your health care provider. Remove it only as told by your health care provider. Incision care    Follow instructions from your health care provider about how to take care of your incision. Make sure you:  Wash your hands with soap and water before you change your bandage (dressing). If soap and water are not available, use hand sanitizer.  Change your dressing as told by your health care provider.  Leave stitches (sutures), skin glue, or adhesive strips in place. These skin closures may need to stay in place for 2 weeks or longer. If adhesive strip edges start to loosen and curl up, you may trim the loose edges. Do not remove adhesive strips completely unless your health care provider tells you to do that.  Check your incision area every day for signs of infection. Check for:  More redness, swelling, or pain.  More fluid or blood.  Warmth.  Pus or a bad smell. Bathing   Do not take baths, swim, or use a hot tub until your health care provider approves. Ask your health care provider if you can take showers. You may only be allowed to take sponge baths for bathing.  If your health care provider approves bathing and showering, cover the dressing with a watertight plastic bag to protect it from water. Do not let the dressing get wet.  Keep the dressing dry until your health care provider says it can be removed. Managing pain, stiffness, and swelling   If directed, apply ice to the affected area.  Put ice in a plastic bag.  Place a towel between your skin and the bag.  Leave the ice on for 20 minutes, 2-3 times a day.  Move your toes often to avoid stiffness and to lessen swelling.  Raise (elevate) the affected area above the level of your heart while you are  sitting or lying down. Driving   Do not drive for 24 hours if you received a sedative.  Do not drive or operate heavy machinery while taking prescription pain medicine.  Ask your health care provider when it is safe to drive if you have been given a boot or shoe to wear on a foot that you use for driving. Activity   If you have crutches, use them to keep weight off your foot as told by your health care provider.  Return to your normal activities as told by your health care provider. Ask your health care provider what activities are safe for you. General instructions   Take over-the-counter and prescription medicines only as told by your health care provider.  Keep all follow-up visits as told by your health care provider. This is important. Contact a health care provider if:  You have more redness, swelling, or pain at the site of your incision.  You have more fluid or blood coming  from your incision.  Your incision feels warm to the touch.  You have pus or a bad smell coming from your incision.  You have a loss of feeling (numbness) in your foot.  You have difficulty moving your foot.  You have a fever. Get help right away if:  You have swelling in your leg or calf. This information is not intended to replace advice given to you by your health care provider. Make sure you discuss any questions you have with your health care provider. Document Released: 02/09/2015 Document Revised: 08/06/2015 Document Reviewed: 11/11/2014 Elsevier Interactive Patient Education  2017 [employer].

## 2016-05-30 NOTE — Progress Notes (Signed)
Subjective: 66 year old female presents the office they for follow-up evaluation of continued chronic left heel pain. She also presents a discussed MRI and as well as for surgical consultation. She's had ongoing chronic heel pain now for 6 months she's attempted numerous conservative treatments including shoe gear changes, orthotics, immobilization, injections, anti-inflammatory, stretching, icing, EPAT without any significant improvement. At this point she wishes to pursue a surgical intervention to help with her pain. Denies any systemic complaints such as fevers, chills, nausea, vomiting. No acute changes since last appointment, and no other complaints at this time.   Objective: AAO x3, NAD DP/PT pulses palpable bilaterally, CRT less than 3 seconds There is continued tenderness to palpation along the plantar medial tubercle of the calcaneus at the insertion of plantar fascia on the left foot. There is no pain along the course of the plantar fascia within the arch of the foot. Plantar fascia appears to be intact. There is no pain with lateral compression of the calcaneus or pain with vibratory sensation. There is no pain along the course or insertion of the achilles tendon. No other areas of tenderness to bilateral lower extremities.  No open lesions or pre-ulcerative lesions.  No pain with calf compression, swelling, warmth, erythema  Assessment: Chronic left heel pain, plantar fasciitis/tear.  Plan: -All treatment options discussed with the patient including all alternatives, risks, complications.  -I reviewed the MRI with the patient. See below. -I discussed both conservative and surgical treatment options with the patient. At this point she is requesting surgical intervention. -I discussed with her endoscopic plantar fascial release, PRP and she wishes to proceed. -The incision placement as well as the postoperative course was discussed with the patient. I discussed risks of the surgery which  include, but not limited to, infection, bleeding, pain, swelling, need for further surgery, delayed or nonhealing, painful or ugly scar, numbness or sensation changes, over/under correction, recurrence, transfer lesions, further deformity, hardware failure, DVT/PE, loss of toe/foot. Patient understands these risks and wishes to proceed with surgery. The surgical consent was reviewed with the patient all 3 pages were signed. No promises or guarantees were given to the outcome of the procedure. All questions were answered to the best of my ability. Before the surgery the patient was encouraged to call the office if there is any further questions. The surgery will be performed at the [facility] on an outpatient basis. -Patient encouraged to call the office with any questions, concerns, change in symptoms.   IMPRESSION: 1. Moderate plantar fasciitis involving the medial band of the plantar fascia adjacent to the calcaneal insertion with a small partial-thickness tear at the calcaneal insertion.  Celesta Gentile, DPM

## 2016-06-08 ENCOUNTER — Other Ambulatory Visit: Payer: BLUE CROSS/BLUE SHIELD

## 2016-06-14 ENCOUNTER — Telehealth: Payer: Self-pay | Admitting: *Deleted

## 2016-06-14 NOTE — Telephone Encounter (Addendum)
Per Madison Fields, patient called and stated she is scheduled for surgery tomorrow.  She said her foot is feeling better and she is not sure she wants surgery.  Please call patient.  "I have surgery scheduled tomorrow with Dr. Jacqualyn Posey.  Actually my foot is getting a lot better.  I want to postpone that if I can.  It's imperative that I speak with you as soon as possible."    I'm returning your call.  You want to cancel surgery?  "Yes, my foot is feeling so much better.  I don't want to have surgery if I can avoid it.    I called and canceled surgery at [facility].

## 2016-06-20 ENCOUNTER — Other Ambulatory Visit: Payer: BLUE CROSS/BLUE SHIELD

## 2016-06-29 ENCOUNTER — Telehealth: Payer: Self-pay | Admitting: *Deleted

## 2016-06-29 NOTE — Telephone Encounter (Signed)
Called and left a message for the patient to see how she was doing after surgery and stated to call the [place] office at [phone] if she needed to speak with me. Lattie Haw

## 2016-12-08 DIAGNOSIS — M7541 Impingement syndrome of right shoulder: Secondary | ICD-10-CM | POA: Diagnosis not present

## 2016-12-08 DIAGNOSIS — M25511 Pain in right shoulder: Secondary | ICD-10-CM | POA: Diagnosis not present

## 2017-01-10 DIAGNOSIS — R69 Illness, unspecified: Secondary | ICD-10-CM | POA: Diagnosis not present

## 2017-01-17 DIAGNOSIS — Z85828 Personal history of other malignant neoplasm of skin: Secondary | ICD-10-CM | POA: Diagnosis not present

## 2017-01-17 DIAGNOSIS — D2271 Melanocytic nevi of right lower limb, including hip: Secondary | ICD-10-CM | POA: Diagnosis not present

## 2017-01-17 DIAGNOSIS — D2261 Melanocytic nevi of right upper limb, including shoulder: Secondary | ICD-10-CM | POA: Diagnosis not present

## 2017-01-17 DIAGNOSIS — D2262 Melanocytic nevi of left upper limb, including shoulder: Secondary | ICD-10-CM | POA: Diagnosis not present

## 2017-01-17 DIAGNOSIS — D2272 Melanocytic nevi of left lower limb, including hip: Secondary | ICD-10-CM | POA: Diagnosis not present

## 2017-01-17 DIAGNOSIS — D225 Melanocytic nevi of trunk: Secondary | ICD-10-CM | POA: Diagnosis not present

## 2017-02-10 ENCOUNTER — Other Ambulatory Visit: Payer: Self-pay | Admitting: Family Medicine

## 2017-02-10 DIAGNOSIS — E559 Vitamin D deficiency, unspecified: Secondary | ICD-10-CM | POA: Diagnosis not present

## 2017-02-10 DIAGNOSIS — M8588 Other specified disorders of bone density and structure, other site: Secondary | ICD-10-CM | POA: Diagnosis not present

## 2017-02-10 DIAGNOSIS — Z1231 Encounter for screening mammogram for malignant neoplasm of breast: Secondary | ICD-10-CM

## 2017-02-10 DIAGNOSIS — R69 Illness, unspecified: Secondary | ICD-10-CM | POA: Diagnosis not present

## 2017-02-10 DIAGNOSIS — Z23 Encounter for immunization: Secondary | ICD-10-CM | POA: Diagnosis not present

## 2017-02-10 DIAGNOSIS — E7800 Pure hypercholesterolemia, unspecified: Secondary | ICD-10-CM | POA: Diagnosis not present

## 2017-02-10 DIAGNOSIS — Z0000 Encounter for general adult medical examination without abnormal findings: Secondary | ICD-10-CM | POA: Diagnosis not present

## 2017-02-16 ENCOUNTER — Ambulatory Visit
Admission: RE | Admit: 2017-02-16 | Discharge: 2017-02-16 | Disposition: A | Discharge status: Home / Self Care | Payer: Medicare HMO | Source: Ambulatory Visit | Attending: Family Medicine | Admitting: Family Medicine

## 2017-02-16 DIAGNOSIS — Z1231 Encounter for screening mammogram for malignant neoplasm of breast: Secondary | ICD-10-CM | POA: Diagnosis not present

## 2017-04-18 DIAGNOSIS — H5213 Myopia, bilateral: Secondary | ICD-10-CM | POA: Diagnosis not present

## 2017-04-26 DIAGNOSIS — M8588 Other specified disorders of bone density and structure, other site: Secondary | ICD-10-CM | POA: Diagnosis not present

## 2017-06-03 IMAGING — DX DG FOOT COMPLETE 3+V*L*
3 series · 3 of 3 positions shown · non-contrast
Comparison: Plain films of the left foot performed at an outside
facility 06/22/2015.

CLINICAL DATA: Bilateral heel pain, worse on the left. No known
injury.

EXAM:
LEFT FOOT - COMPLETE 3+ VIEW

[foot ap]
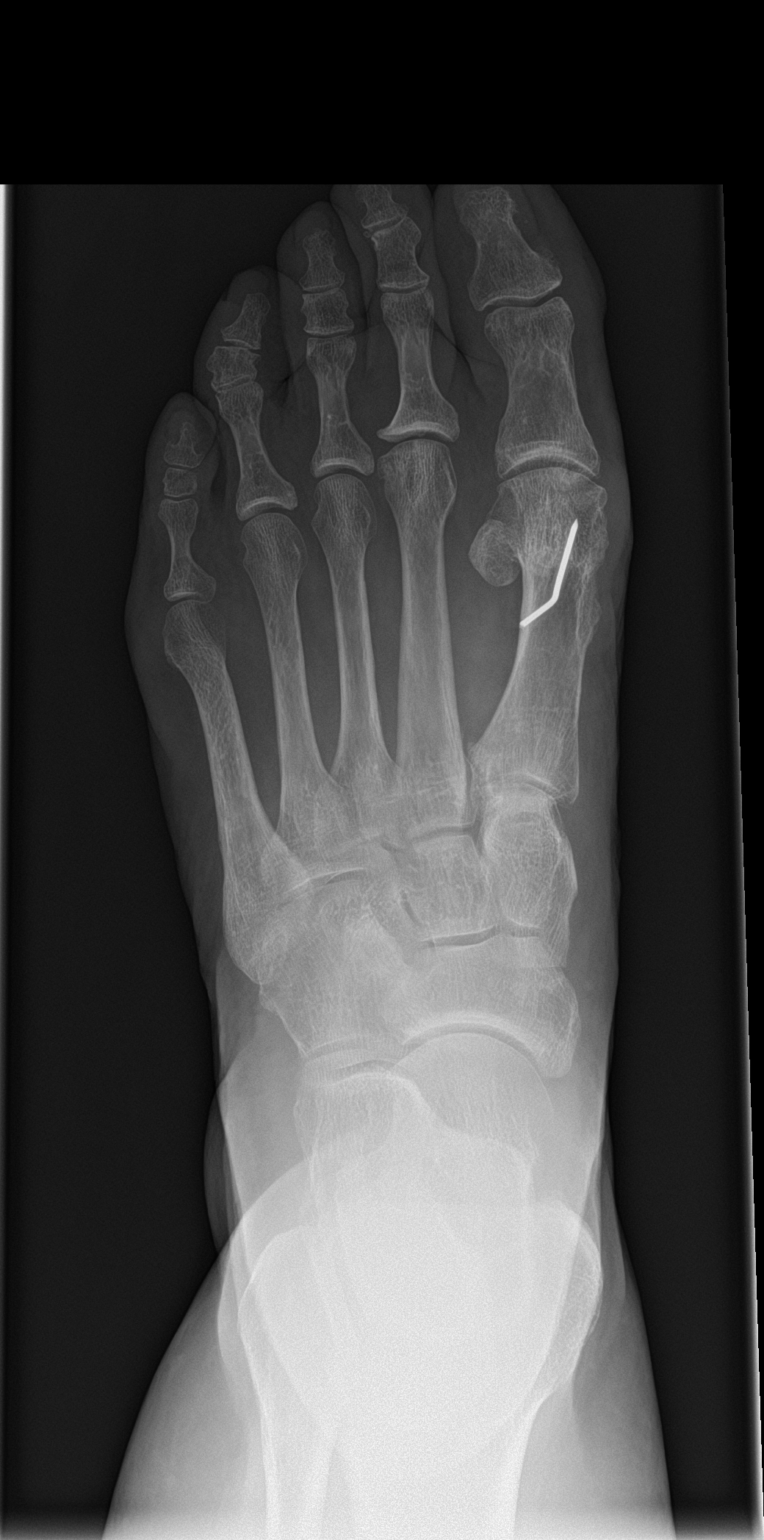

[foot obl]
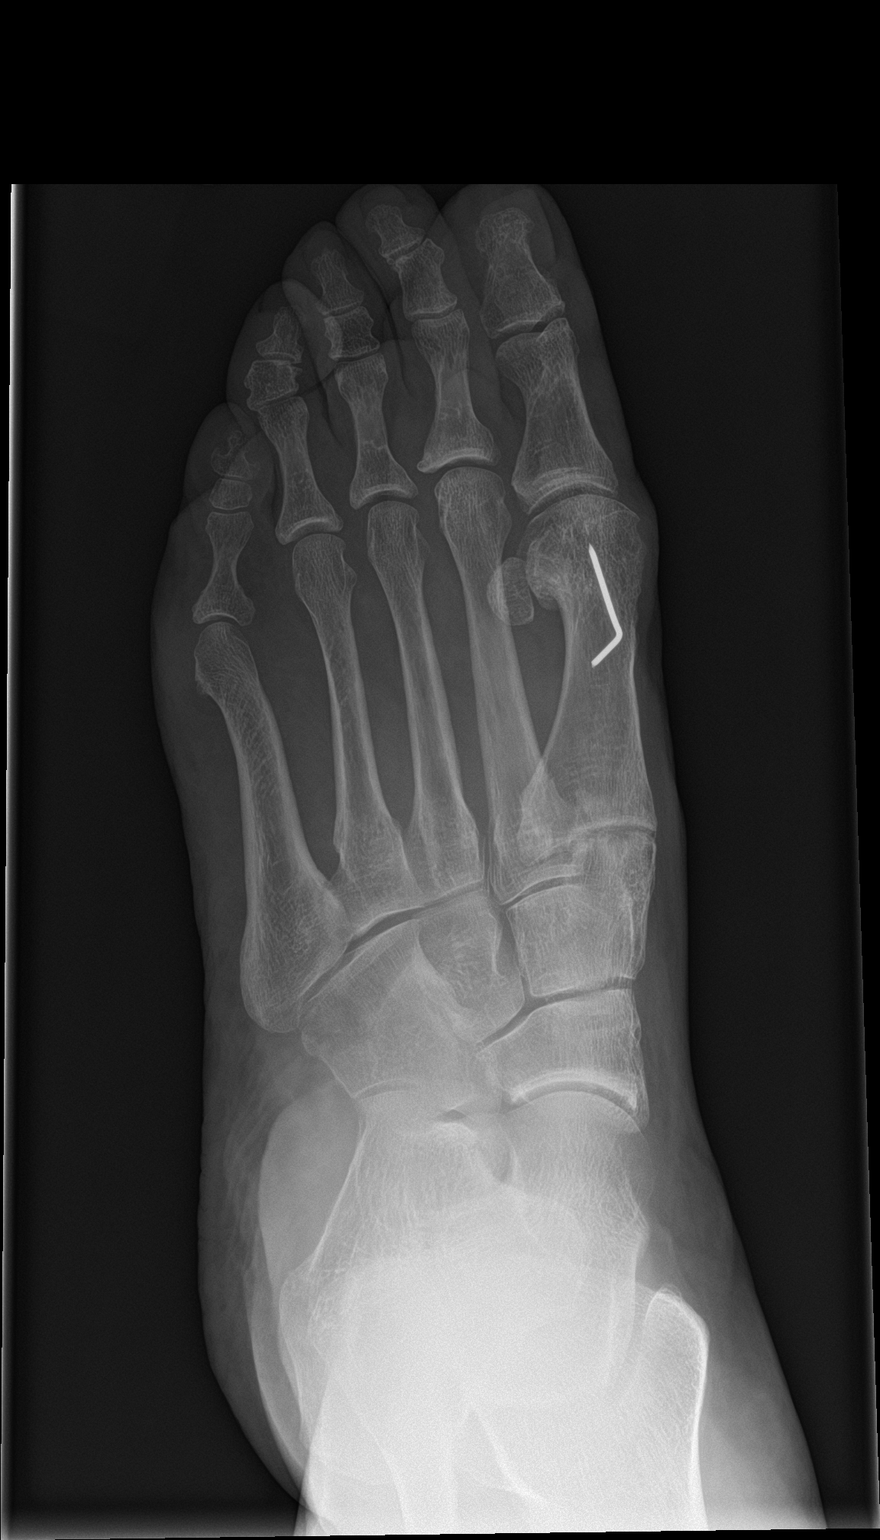

[foot lat]
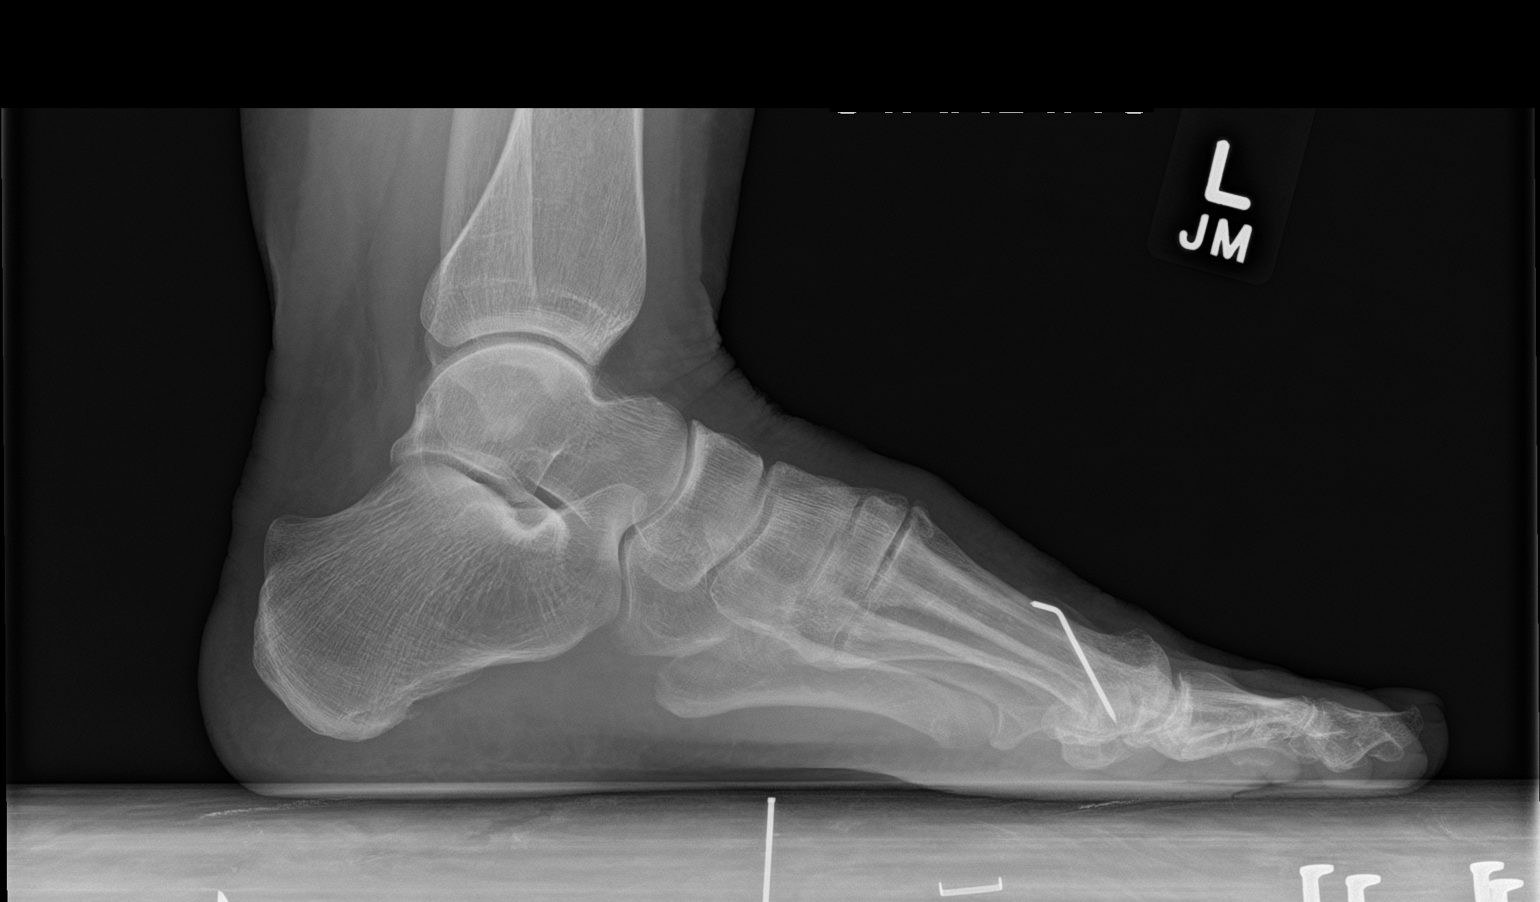

[3 of 3 positions shown; findings below may reference images not displayed]

FINDINGS: The calcaneus appears normal. The patient is status post hallux
valgus repair without evidence of complication. Mild first and
second MTP degenerative disease is noted.
IMPRESSION: No acute abnormality or finding to explain patient's heel pain.

Mild first and second MTP degenerative disease.

Status post hallux valgus repair.

## 2017-07-04 DIAGNOSIS — M25511 Pain in right shoulder: Secondary | ICD-10-CM | POA: Diagnosis not present

## 2017-07-04 DIAGNOSIS — G5630 Lesion of radial nerve, unspecified upper limb: Secondary | ICD-10-CM | POA: Diagnosis not present

## 2017-07-21 DIAGNOSIS — R69 Illness, unspecified: Secondary | ICD-10-CM | POA: Diagnosis not present

## 2017-11-12 DIAGNOSIS — R69 Illness, unspecified: Secondary | ICD-10-CM | POA: Diagnosis not present

## 2020-01-31 NOTE — Telephone Encounter (Signed)
error
